# Patient Record
Sex: Male | Born: 1965 | Race: Black or African American | Hispanic: No | Marital: Married | Smoking: Never smoker
Health system: Southern US, Community
[De-identification: ages and names within clinical notes are randomized; demographics above are authoritative.]

## PROBLEM LIST (undated history)

## (undated) DIAGNOSIS — K219 Gastro-esophageal reflux disease without esophagitis: Secondary | ICD-10-CM

## (undated) DIAGNOSIS — I10 Essential (primary) hypertension: Secondary | ICD-10-CM

## (undated) DIAGNOSIS — E7800 Pure hypercholesterolemia, unspecified: Secondary | ICD-10-CM

---

## 2009-08-13 ENCOUNTER — Encounter: Admission: RE | Admit: 2009-08-13 | Discharge: 2009-08-13 | Payer: Self-pay | Admitting: Cardiology

## 2009-08-19 ENCOUNTER — Ambulatory Visit (HOSPITAL_COMMUNITY): Admission: RE | Admit: 2009-08-19 | Discharge: 2009-08-19 | Payer: Self-pay | Admitting: Cardiology

## 2010-10-23 LAB — GLUCOSE, CAPILLARY
Glucose-Capillary: 82 mg/dL (ref 70–99)
Glucose-Capillary: 94 mg/dL (ref 70–99)

## 2010-10-23 LAB — CBC: RBC: 4.99 MIL/uL (ref 4.22–5.81)

## 2010-12-31 IMAGING — CR DG CHEST 2V
2 series · 2 of 2 positions shown · non-contrast
Comparison: None

CLINICAL DATA: Chest pain.  Preoperative evaluation for cardiac
catheterization.  Nonsmoker

CHEST - 2 VIEW

[view not recorded (1 of 2)]
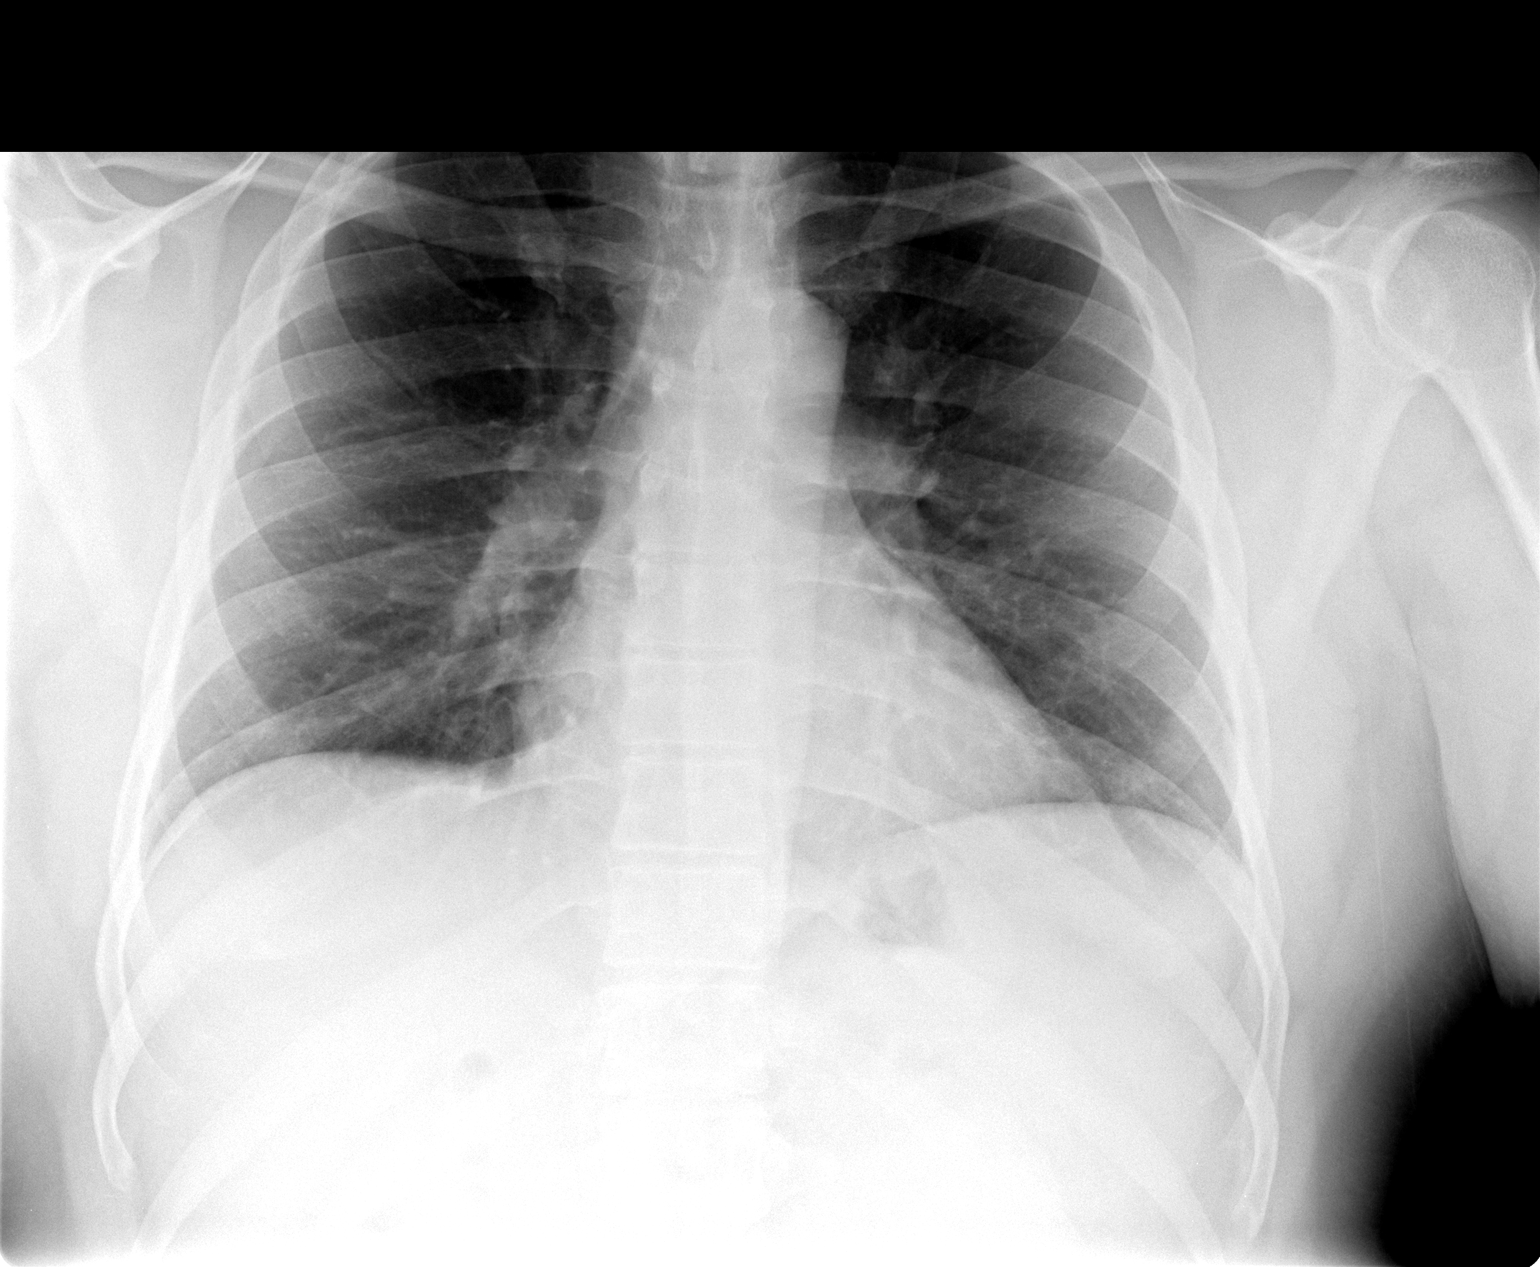

[view not recorded (2 of 2)]
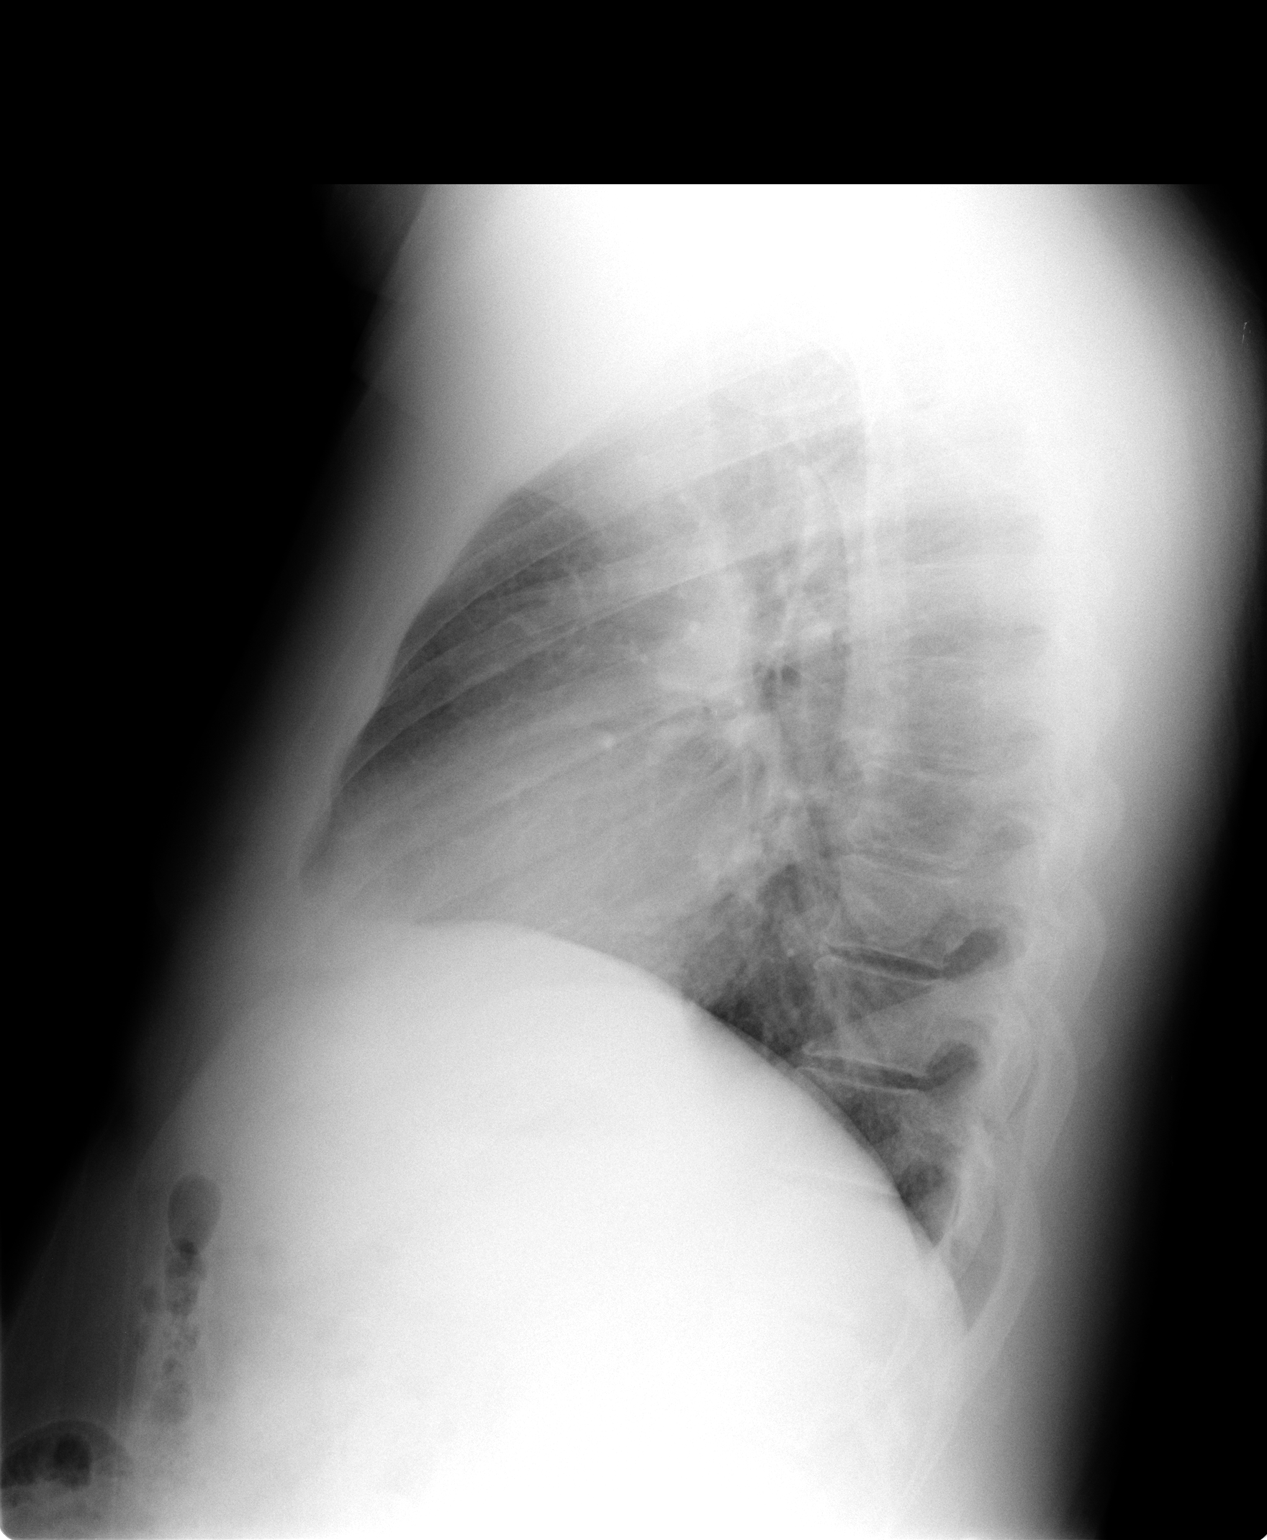

[2 of 2 positions shown; findings below may reference images not displayed]

FINDINGS: Poor lung volumes are present with resultant crowding of
the bronchovascular markings at both lung bases.  Taking this into
consideration heart and mediastinal contours are within normal
limits.  The lung fields are otherwise clear with no evidence for
focal infiltrate, congestive failure, peribronchial cuffing or
pleural fluid.

Bony structures appear intact.
IMPRESSION: No acute cardiopulmonary abnormality noted.

## 2012-01-04 ENCOUNTER — Encounter (HOSPITAL_COMMUNITY): Payer: Self-pay | Admitting: *Deleted

## 2012-01-04 ENCOUNTER — Emergency Department (HOSPITAL_COMMUNITY)
Admission: EM | Admit: 2012-01-04 | Discharge: 2012-01-04 | Disposition: A | Discharge status: Home / Self Care | Payer: 59 | Attending: Emergency Medicine | Admitting: Emergency Medicine

## 2012-01-04 DIAGNOSIS — E7800 Pure hypercholesterolemia, unspecified: Secondary | ICD-10-CM | POA: Insufficient documentation

## 2012-01-04 DIAGNOSIS — I10 Essential (primary) hypertension: Secondary | ICD-10-CM | POA: Insufficient documentation

## 2012-01-04 DIAGNOSIS — E119 Type 2 diabetes mellitus without complications: Secondary | ICD-10-CM | POA: Insufficient documentation

## 2012-01-04 DIAGNOSIS — K625 Hemorrhage of anus and rectum: Secondary | ICD-10-CM | POA: Insufficient documentation

## 2012-01-04 DIAGNOSIS — K219 Gastro-esophageal reflux disease without esophagitis: Secondary | ICD-10-CM | POA: Insufficient documentation

## 2012-01-04 HISTORY — DX: Gastro-esophageal reflux disease without esophagitis: K21.9

## 2012-01-04 HISTORY — DX: Pure hypercholesterolemia, unspecified: E78.00

## 2012-01-04 HISTORY — DX: Essential (primary) hypertension: I10

## 2012-01-04 LAB — DIFFERENTIAL
Basophils Relative: 1 % (ref 0–1)
Eosinophils Absolute: 0.2 10*3/uL (ref 0.0–0.7)
Eosinophils Relative: 3 % (ref 0–5)
Lymphs Abs: 2.5 10*3/uL (ref 0.7–4.0)
Monocytes Absolute: 0.4 10*3/uL (ref 0.1–1.0)
Monocytes Relative: 4 % (ref 3–12)

## 2012-01-04 LAB — COMPREHENSIVE METABOLIC PANEL
Albumin: 4.2 g/dL (ref 3.5–5.2)
CO2: 30 mEq/L (ref 19–32)
Chloride: 98 mEq/L (ref 96–112)
Creatinine, Ser: 0.95 mg/dL (ref 0.50–1.35)
GFR calc Af Amer: >90 mL/min (ref >90)
GFR calc non Af Amer: >90 mL/min (ref >90)
Glucose, Bld: 133 mg/dL — ABNORMAL HIGH (ref 70–99)
Potassium: 3.6 mEq/L (ref 3.5–5.1)
Total Bilirubin: 0.4 mg/dL (ref 0.3–1.2)

## 2012-01-04 LAB — CBC
HCT: 38.6 % — ABNORMAL LOW (ref 39.0–52.0)
Hemoglobin: 13 g/dL (ref 13.0–17.0)
MCHC: 33.7 g/dL (ref 30.0–36.0)

## 2012-01-04 NOTE — Discharge Instructions (Signed)
Don't take any aspirin until all signs of bleeding have stopped for 1 week. Make an appointment with your primary care provider. Discuss with him whether you should have another colonoscopy. Return to the emergency department if breathing is getting worse.  Rectal Bleeding Rectal bleeding is when blood passes out of the anus. It is usually a sign that something is wrong. It may not be serious, but it should always be evaluated. Rectal bleeding may present as bright red blood or extremely dark stools. The color may range from dark red or maroon to black (like tar). It is important that the cause of rectal bleeding be identified so treatment can be started and the problem corrected. CAUSES   Hemorrhoids. These are enlarged (dilated) blood vessels or veins in the anal or rectal area.   Fistulas. Theseare abnormal, burrowing channels that usually run from inside the rectum to the skin around the anus. They can bleed.   Anal fissures. This is a tear in the tissue of the anus. Bleeding occurs with bowel movements.   Diverticulosis. This is a condition in which pockets or sacs project from the bowel wall. Occasionally, the sacs can bleed.   Diverticulitis. Thisis an infection involving diverticulosis of the colon.   Proctitis and colitis. These are conditions in which the rectum, colon, or both, can become inflamed and pitted (ulcerated).   Polyps and cancer. Polyps are non-cancerous (benign) growths in the colon that may bleed. Certain types of polyps turn into cancer.   Protrusion of the rectum. Part of the rectum can project from the anus and bleed.   Certain medicines.   Intestinal infections.   Blood vessel abnormalities.  HOME CARE INSTRUCTIONS  Eat a high-fiber diet to keep your stool soft.   Limit activity.   Drink enough fluids to keep your urine clear or pale yellow.   Warm baths may be useful to soothe rectal pain.   Follow up with your caregiver as directed.  SEEK  IMMEDIATE MEDICAL CARE IF:  You develop increased bleeding.   You have black or dark red stools.   You vomit blood or material that looks like coffee grounds.   You have abdominal pain or tenderness.   You have a fever.   You feel weak, nauseous, or you faint.   You have severe rectal pain or you are unable to have a bowel movement.  MAKE SURE YOU:  Understand these instructions.   Will watch your condition.   Will get help right away if you are not doing well or get worse.  Document Released: 01/13/2002 Document Revised: 07/13/2011 Document Reviewed: 01/08/2011 [facility] Patient Information 2012 [place], [place].

## 2012-01-04 NOTE — ED Notes (Signed)
Pt reports having large amounts of bright red blood in stools starting this am. Also wants jaw pain examined, left jaw and throat pain x 1 week. Airway intact, having n/v.

## 2012-01-04 NOTE — ED Provider Notes (Signed)
History  This chart was scribed for Dione Booze, MD by Bennett Scrape. This patient was seen in room STRE8/STRE8 and the patient's care was started at 12:50PM.  CSN: [id number]  Arrival date & time 01/04/12  1130   First MD Initiated Contact with Patient 01/04/12 1250      Chief Complaint  Patient presents with  . Rectal Bleeding    The history is provided by the patient. No language interpreter was used.    Christopher Beltran is a 46 y.o. male who presents to the Emergency Department complaining of two episodes of hematochezia described as bright red blood with associated fatigue. He states that the first episode was similar to episodes he has experienced in the past with constipation. He states that he became concerned when the second episode occurred only one hour after the first and he describes it as being "a large amount of bright red blood with a small amount of stool". He reports that he had a negative colonoscopy about 6 years ago. He denies abdominal pain, nausea and emesis as associated symptoms. He is an occasional alcohol user but denies smoking.  PCP is Dr. Beryle Quant.   Past Medical History  Diagnosis Date  . Diabetes mellitus   . Hypertension   . Acid reflux   . High cholesterol     History reviewed. No pertinent past surgical history.  History reviewed. No pertinent family history.  History  Substance Use Topics  . Smoking status: Not on file  . Smokeless tobacco: Not on file  . Alcohol Use: Yes     occ wine      Review of Systems  Constitutional: Positive for fatigue. Negative for fever and chills.  Respiratory: Negative for shortness of breath.   Gastrointestinal: Positive for blood in stool. Negative for nausea and vomiting.  Neurological: Negative for weakness.    Allergies  Statins  Home Medications   Current Outpatient Rx  Name Route Sig Dispense Refill  . CHOLECALCIFEROL 400 UNITS PO TABS Oral Take 400 Units by mouth daily.    Marland Kitchen  GLIPIZIDE-METFORMIN HCL 5-500 MG PO TABS Oral Take 1 tablet by mouth 2 (two) times daily before a meal.    . IBUPROFEN 200 MG PO TABS Oral Take 200 mg by mouth every 6 (six) hours as needed. For pain    . LOSARTAN POTASSIUM-HCTZ 50-12.5 MG PO TABS Oral Take 1 tablet by mouth daily.    Marland Kitchen RABEPRAZOLE SODIUM 20 MG PO TBEC Oral Take 20 mg by mouth daily.      Triage Vitals: BP 138/84  Pulse 89  Temp(Src) 98 F (36.7 C) (Oral)  Resp 15  SpO2 100%  Physical Exam  Nursing note and vitals reviewed. Constitutional: He is oriented to person, place, and time. He appears well-developed and well-nourished. No distress.  HENT:  Head: Normocephalic and atraumatic.  Eyes: EOM are normal.  Neck: Neck supple. No tracheal deviation present.  Cardiovascular: Normal rate.   Pulmonary/Chest: Effort normal. No respiratory distress.  Genitourinary: Rectum normal.       Small amount of bright red blood  Musculoskeletal: Normal range of motion.  Neurological: He is alert and oriented to person, place, and time.  Skin: Skin is warm and dry.  Psychiatric: He has a normal mood and affect. His behavior is normal.    ED Course  Procedures (including critical care time)  DIAGNOSTIC STUDIES: Oxygen Saturation is 100% on room air, normal by my interpretation.    COORDINATION OF  CARE: 1:00PM-Discussed treatment plan with pt and pt agreed to plan. 2:39PM-Pt rechecked and is feeling better. 2:49PM-Discussed lab results and discharge plan with pt and pt agreed. Advised pt to follow up with PCP.  Results for orders placed during the hospital encounter of 01/04/12  COMPREHENSIVE METABOLIC PANEL      Component Value Range   Sodium 138  135 - 145 (mEq/L)   Potassium 3.6  3.5 - 5.1 (mEq/L)   Chloride 98  96 - 112 (mEq/L)   CO2 30  19 - 32 (mEq/L)   Glucose, Bld 133 (*) 70 - 99 (mg/dL)   BUN 14  6 - 23 (mg/dL)   Creatinine, Ser 0.10  0.50 - 1.35 (mg/dL)   Calcium 27.2  8.4 - 10.5 (mg/dL)   Total Protein 7.9   6.0 - 8.3 (g/dL)   Albumin 4.2  3.5 - 5.2 (g/dL)   AST 21  0 - 37 (U/L)   ALT 23  0 - 53 (U/L)   Alkaline Phosphatase 60  39 - 117 (U/L)   Total Bilirubin 0.4  0.3 - 1.2 (mg/dL)   GFR calc non Af Amer >90  >90 (mL/min)   GFR calc Af Amer >90  >90 (mL/min)  CBC      Component Value Range   WBC 8.8  4.0 - 10.5 (K/uL)   RBC 5.02  4.22 - 5.81 (MIL/uL)   Hemoglobin 13.0  13.0 - 17.0 (g/dL)   HCT 53.6 (*) 64.4 - 52.0 (%)   MCV 76.9 (*) 78.0 - 100.0 (fL)   MCH 25.9 (*) 26.0 - 34.0 (pg)   MCHC 33.7  30.0 - 36.0 (g/dL)   RDW 03.4  74.2 - 59.5 (%)   Platelets 386  150 - 400 (K/uL)  DIFFERENTIAL      Component Value Range   Neutrophils Relative 64  43 - 77 (%)   Neutro Abs 5.6  1.7 - 7.7 (K/uL)   Lymphocytes Relative 29  12 - 46 (%)   Lymphs Abs 2.5  0.7 - 4.0 (K/uL)   Monocytes Relative 4  3 - 12 (%)   Monocytes Absolute 0.4  0.1 - 1.0 (K/uL)   Eosinophils Relative 3  0 - 5 (%)   Eosinophils Absolute 0.2  0.0 - 0.7 (K/uL)   Basophils Relative 1  0 - 1 (%)   Basophils Absolute 0.1  0.0 - 0.1 (K/uL)  OCCULT BLOOD, POC DEVICE      Component Value Range   Fecal Occult Bld POSITIVE       1. Rectal bleeding       MDM  Rectal bleeding but he does not appear to be actively bleeding currently. Orthostatic vital signs will be checked as well as hemoglobin.  Hemoglobin is normal. He was observed in the emergency department and had another bowel movement with significantly less blood. He continues to be hemodynamically stable. I see no indication for hospital admission. He is discharged with instructions to follow up with his gastroenterologist to consider repeat colonoscopy.      I personally performed the services described in this documentation, which was scribed in my presence. The recorded information has been reviewed and considered.      Dione Booze, MD 01/07/12 [phone]

## 2012-01-04 NOTE — ED Notes (Signed)
Pt presents with onset of bright red blood from rectum first noted this morning.  Pt reports pain to L side of abdomen that radiates into lower abdomen and generalized malaise x 1-2 weeks.  Pt reports bleeding only with bowel movement, denies any rectal pain or rectal penetration.  Pt also reports L jaw pain x 2 weeks, had dental check up with cleaning but did not receive any injections, reports pain has been constant and worsens when chewing.

## 2013-12-19 ENCOUNTER — Other Ambulatory Visit: Payer: Self-pay | Admitting: Internal Medicine

## 2013-12-19 DIAGNOSIS — M545 Low back pain, unspecified: Secondary | ICD-10-CM

## 2013-12-25 ENCOUNTER — Ambulatory Visit
Admission: RE | Admit: 2013-12-25 | Discharge: 2013-12-25 | Disposition: A | Discharge status: Home / Self Care | Source: Ambulatory Visit | Attending: Internal Medicine | Admitting: Internal Medicine

## 2013-12-25 DIAGNOSIS — M545 Low back pain, unspecified: Secondary | ICD-10-CM

## 2013-12-25 MED ORDER — GADOBENATE DIMEGLUMINE 529 MG/ML IV SOLN
19.0000 mL | Freq: Once | INTRAVENOUS | Status: AC | PRN
  Administered 2013-12-25: 19 mL via INTRAVENOUS

## 2014-01-12 ENCOUNTER — Encounter: Payer: Self-pay | Admitting: Podiatry

## 2014-01-12 ENCOUNTER — Ambulatory Visit (INDEPENDENT_AMBULATORY_CARE_PROVIDER_SITE_OTHER): Admitting: Podiatry

## 2014-01-12 VITALS — BP 119/80 | HR 88 | Ht 70.0 in | Wt 210.0 lb

## 2014-01-12 DIAGNOSIS — B351 Tinea unguium: Secondary | ICD-10-CM

## 2014-01-12 DIAGNOSIS — E119 Type 2 diabetes mellitus without complications: Secondary | ICD-10-CM | POA: Insufficient documentation

## 2014-01-12 NOTE — Patient Instructions (Signed)
Seen for diabetic foot check. Noted of light fungal nails.  May benefit from OTC antifungal drops. Return as needed.

## 2014-01-12 NOTE — Progress Notes (Signed)
Subjective: 48 year old male presents for diabetic foot check.  Diabetic since 1999. Blood sugar is under control. Hg A1C was up little, 8.9. Now getting medication adjusted.  Review of Systems - General ROS: negative for - chills, fatigue, fever, night sweats, sleep disturbance, weight gain or weight loss Ophthalmic ROS: negative for - blurry vision, decreased vision, double vision, itchy eyes or loss of vision ENT ROS: negative Allergy and Immunology ROS: Hayfever, pollens. Respiratory ROS: no cough, shortness of breath, or wheezing Cardiovascular ROS: no chest pain or dyspnea on exertion Gastrointestinal ROS: no abdominal pain, change in bowel habits, or black or bloody stools Genito-Urinary ROS: no dysuria, trouble voiding, or hematuria Musculoskeletal ROS: Back and shoulder pain random. Shoulder pain from previous injury.  Neurological ROS: no TIA or stroke symptoms Dermatological ROS: None abnormal.   Objective: Dermatologic: No open lesions. Mild yellow thickened nails x 10.  Vascular: All pedal pulses are palpable. No edema or erythema.  Neurologic: Normal response to Monofilament sensory testing.  Orthopedic: Rectus foot without gross deformities.  Assessment: Mild fungal nails x 10.   Plan: Reviewed findings.  Many benefit from antifungal medication.

## 2015-02-23 ENCOUNTER — Encounter: Payer: Self-pay | Admitting: Physical Therapy

## 2015-02-23 ENCOUNTER — Ambulatory Visit: Payer: Federal, State, Local not specified - PPO | Attending: Internal Medicine | Admitting: Physical Therapy

## 2015-02-23 DIAGNOSIS — M5441 Lumbago with sciatica, right side: Secondary | ICD-10-CM | POA: Insufficient documentation

## 2015-02-23 NOTE — Therapy (Signed)
[facility] [place] [address] [place], [place], [address] Phone: [phone]   Fax:  [phone]  Physical Therapy Evaluation  Patient Details  Name: Christopher Beltran MRN: [id number] Date of Birth: 09/14/1965 Referring Provider:  Jackie Plumsei-Bonsu, George, MD  Encounter Date: 02/23/2015      PT End of Session - 02/23/15 1441    Visit Number 1   Date for PT Re-Evaluation 04/26/15   PT Start Time 1404   PT Stop Time 1500   PT Time Calculation (min) 56 min   Activity Tolerance Patient tolerated treatment well   Behavior [facility] Therapy [facility] for tasks assessed/performed      Past Medical History  Diagnosis Date  . Diabetes mellitus   . Hypertension   . Acid reflux   . High cholesterol     History reviewed. No pertinent past surgical history.  There were no vitals filed for this visit.  Visit Diagnosis:  Right-sided low back pain with right-sided sciatica - Plan: PT plan of care cert/re-cert      Subjective Assessment - 02/23/15 1405    Subjective Patient reports that he has been having some back pain for about 20 years since he was in the [employer].  Reports recently worse, he is unsure of any specific cause.     Limitations Sitting;Lifting;House hold activities   How long can you sit comfortably? 45 minutes   Diagnostic tests MRI slight bulging L5-S1 and some degeneration   Currently in Pain? Yes   Pain Score 5    Pain Location Back   Pain Orientation Lower;Right   Pain Type Chronic pain   Pain Radiating Towards down into the right HS   Pain Onset More than a month ago   Pain Frequency Constant   Aggravating Factors  in the morning, with walking, pain up to 7/10   Pain Relieving Factors sometimes walking helps, at best 2/10   Effect of Pain on Daily Activities limits some ADL's due to pain            [facility] Assessment - 02/23/15 0001    Assessment   Medical Diagnosis low back pain   Onset Date/Surgical Date  12/24/14   Prior Therapy 20 years ago   Precautions   Precautions None   Balance Screen   Has the patient fallen in the past 6 months No   Has the patient had a decrease in activity level because of a fear of falling?  No   Is the patient reluctant to leave their home because of a fear of falling?  No   Home [employer] information [employer] managernvironment   Living Environment Private residence   Prior Function   Level of Independence Independent   Vocation Part time employment   Vocation Requirements mostly sitting, computer work   Leisure some exercise and basketball a few times a week   Posture/Postural Control   Posture Comments some slouched sitting   AROM   Overall AROM Comments Lumbar ROM was decreased 50%   Flexibility   Soft Tissue Assessment /Muscle Length --  very tight ITB, HS and piriformis mms   Palpation   Palpation comment very tender over a knot in the right lumbar area at L4-5, tight ITB and HS                           PT Education - 02/23/15 1440    Education provided Yes   Education  Details Wms flexion exercises, HS, piriformis and ITB stretches   Person(s) Educated Patient   Methods Explanation;Demonstration;Handout   Comprehension Verbalized understanding          PT Short Term Goals - 03-16-2015 1451    PT SHORT TERM GOAL #1   Title independent with initial HEP   Time 1   Period Weeks   Status New           PT Long Term Goals - 03/16/15 1451    PT LONG TERM GOAL #1   Title decrease pain 50%   Time 8   Period Weeks   Status New   PT LONG TERM GOAL #2   Title increase lumbar ROM to WNL's   Time 8   Period Weeks   Status New   PT LONG TERM GOAL #3   Title understand proper posture and body mechanics   Time 8   Period Weeks   Status New   PT LONG TERM GOAL #4   Title increase SLR to 70 degrees   Baseline currently at 40 degrees   Time 8   Period Weeks   Status New               Plan - 16-Mar-2015 1448    Clinical Impression Statement  Patient with c/o low back pain, reports has been chronic but usually he can get it to go away, reports that this episode has lingered to be come constant pain and the pain has gotten worse   Pt will benefit from skilled therapeutic intervention in order to improve on the following deficits Decreased range of motion;Impaired flexibility;Increased muscle spasms;Pain   Rehab Potential Good   PT Frequency 2x / week   PT Duration 8 weeks   PT Treatment/Interventions ADLs/Self Care Home Management;Electrical Stimulation;Ultrasound;Traction;Moist Heat;Functional mobility training;Therapeutic exercise;Manual techniques   PT Next Visit Plan add gym exercises   Consulted and Agree with Plan of Care Patient          G-Codes - 03-16-15 1458    Functional Assessment Tool Used foto   Functional Limitation Other PT primary   Other PT Primary Current Status (Z6109) At least 40 percent but less than 60 percent impaired, limited or restricted   Other PT Primary Goal Status (U0454) At least 20 percent but less than 40 percent impaired, limited or restricted       Problem List Patient Active Problem List   Diagnosis Date Noted  . Onychomycosis 01/12/2014  . Diabetes 01/12/2014    Jearld Lesch., PT 16-Mar-2015, 3:01 PM  [facility] [place] [address] [place], [place], [address] Phone: [phone]   Fax:  [phone]

## 2015-03-01 ENCOUNTER — Ambulatory Visit: Payer: Federal, State, Local not specified - PPO | Admitting: Physical Therapy

## 2015-03-01 ENCOUNTER — Encounter: Payer: Self-pay | Admitting: Physical Therapy

## 2015-03-01 DIAGNOSIS — M5441 Lumbago with sciatica, right side: Secondary | ICD-10-CM | POA: Diagnosis not present

## 2015-03-01 NOTE — Therapy (Signed)
[facility] [place] [address] [place], [place], [address] Phone: [phone]   Fax:  [phone]  Physical Therapy Treatment  Patient Details  Name: Christopher Beltran MRN: [id number] Date of Birth: 08-May-1966 Referring Provider:  Jackie Plum, MD  Encounter Date: 03/01/2015      PT End of Session - 03/01/15 0839    Visit Number 2   PT Start Time 0757   PT Stop Time 0900   PT Time Calculation (min) 63 min   Activity Tolerance Patient tolerated treatment well   Behavior During Therapy [facility] for tasks assessed/performed      Past Medical History  Diagnosis Date  . Diabetes mellitus   . Hypertension   . Acid reflux   . High cholesterol     History reviewed. No pertinent past surgical history.  There were no vitals filed for this visit.  Visit Diagnosis:  Right-sided low back pain with right-sided sciatica      Subjective Assessment - 03/01/15 0759    Subjective Some soreness in the low back.  Has c/o weakness in the right leg more than the left.   Currently in Pain? Yes   Pain Score 3    Pain Location Back   Pain Orientation Right;Lower   Pain Descriptors / Indicators Aching   Pain Type Chronic pain   Pain Onset More than a month ago   Pain Frequency Constant                         [facility] Adult PT Treatment/Exercise - 03/01/15 0001    Lumbar Exercises: Aerobic   Tread Mill NuStep L5 x 5 minutes   Lumbar Exercises: Machines for Strengthening   Leg Press 30# 2x10   Other Lumbar Machine Exercise lat pulls 25#, seated row 25#   Other Lumbar Machine Exercise 10# obliques   Lumbar Exercises: Supine   Bridge 20 reps;2 seconds   Bridge Limitations feet on ball   Large Ball Abdominal Isometric 20 reps;2 seconds   Other Supine Lumbar Exercises feet on ball K2C and trunk rotation   Modalities   Modalities Traction   Programme researcher, broadcasting/film/video Location lumbar spine   Electrical Stimulation Action IFC   Electrical Stimulation Parameters tolerance   Electrical Stimulation Goals Pain   Traction   Type of Traction Lumbar   Min (lbs) 60   Max (lbs) 60   Hold Time static   Time 15                  PT Short Term Goals - 02/23/15 1451    PT SHORT TERM GOAL #1   Title independent with initial HEP   Time 1   Period Weeks   Status New           PT Long Term Goals - 02/23/15 1451    PT LONG TERM GOAL #1   Title decrease pain 50%   Time 8   Period Weeks   Status New   PT LONG TERM GOAL #2   Title increase lumbar ROM to WNL's   Time 8   Period Weeks   Status New   PT LONG TERM GOAL #3   Title understand proper posture and body mechanics   Time 8   Period Weeks   Status New   PT LONG TERM GOAL #4   Title increase SLR to 70 degrees   Baseline currently at  40 degrees   Time 8   Period Weeks   Status New               Plan - 03/01/15 1610    Clinical Impression Statement Did well with exercises, he is weak in the core and very tight in the LE's.   PT Next Visit Plan add stretches manually next visit   Consulted and Agree with Plan of Care Patient        Problem List Patient Active Problem List   Diagnosis Date Noted  . Onychomycosis 01/12/2014  . Diabetes 01/12/2014    Jearld Lesch., PT 03/01/2015, 8:40 AM  [facility] [place] [address] [place], [place], [address] Phone: [phone]   Fax:  [phone]

## 2015-03-03 ENCOUNTER — Encounter: Payer: Self-pay | Admitting: Physical Therapy

## 2015-03-03 ENCOUNTER — Ambulatory Visit: Payer: Federal, State, Local not specified - PPO | Admitting: Physical Therapy

## 2015-03-03 DIAGNOSIS — M5441 Lumbago with sciatica, right side: Secondary | ICD-10-CM | POA: Diagnosis not present

## 2015-03-03 NOTE — Therapy (Signed)
[facility] [place] [address] [place], [place], [address] Phone: [phone]   Fax:  [phone]  Physical Therapy Treatment  Patient Details  Name: Christopher Beltran MRN: [id number] Date of Birth: June 01, 1966 Referring Provider:  Jackie Plum, MD  Encounter Date: 03/03/2015      PT End of Session - 03/03/15 0926    Visit Number 3   Date for PT Re-Evaluation 04/26/15   PT Start Time 0847   PT Stop Time 0942   PT Time Calculation (min) 55 min      Past Medical History  Diagnosis Date  . Diabetes mellitus   . Hypertension   . Acid reflux   . High cholesterol     History reviewed. No pertinent past surgical history.  There were no vitals filed for this visit.  Visit Diagnosis:  Right-sided low back pain with right-sided sciatica      Subjective Assessment - 03/03/15 0848    Subjective Pt reports that he is a little tired but has his normal soreness   Limitations Sitting;Lifting;House hold activities   Diagnostic tests MRI slight bulging L5-S1 and some degeneration   Currently in Pain? Yes   Pain Score 3    Pain Location Back   Pain Orientation Right   Pain Descriptors / Indicators Sore   Pain Type Chronic pain   Pain Onset More than a month ago                         [facility] Adult PT Treatment/Exercise - 03/03/15 0001    Lumbar Exercises: Aerobic   Tread Mill NuStep L5 x 5 minutes   Lumbar Exercises: Machines for Strengthening   Leg Press 50# 2x10   Other Lumbar Machine Exercise lat pulls 35#   Other Lumbar Machine Exercise Standing AR press #25 10 reps    Lumbar Exercises: Standing   Row Power tower;Strengthening;10 reps  3 sets, #55   Other Standing Lumbar Exercises Straight arm pull downs # 55 10 reps 3 sets    Modalities   Modalities Traction   Traction   Type of Traction Lumbar   Min (lbs) 75   Max (lbs) 75   Hold Time static   Time 15                  PT  Short Term Goals - 02/23/15 1451    PT SHORT TERM GOAL #1   Title independent with initial HEP   Time 1   Period Weeks   Status New           PT Long Term Goals - 03/03/15 0932    PT LONG TERM GOAL #3   Title understand proper posture and body mechanics   Status On-going               Plan - 03/03/15 0927    Clinical Impression Statement Pt tolerated exercises with increased intensities and weight. Pt core remains weak and requires cues to maintain proper posture    Pt will benefit from skilled therapeutic intervention in order to improve on the following deficits Decreased range of motion;Impaired flexibility;Increased muscle spasms;Pain   PT Frequency 2x / week   PT Duration 8 weeks   PT Treatment/Interventions ADLs/Self Care Home Management;Electrical Stimulation;Ultrasound;Traction;Moist Heat;Functional mobility training;Therapeutic exercise;Manual techniques   PT Next Visit Plan add stretches manually next visit        Problem List Patient Active  Problem List   Diagnosis Date Noted  . Onychomycosis 01/12/2014  . Diabetes 01/12/2014    Grayce Sessions, PTA 03/03/2015, 9:33 AM  [facility] [place] [address] [place], [place], [address] Phone: [phone]   Fax:  [phone]

## 2015-03-09 ENCOUNTER — Ambulatory Visit: Payer: Federal, State, Local not specified - PPO | Attending: Internal Medicine | Admitting: Physical Therapy

## 2015-03-09 ENCOUNTER — Encounter: Payer: Self-pay | Admitting: Physical Therapy

## 2015-03-09 DIAGNOSIS — M5441 Lumbago with sciatica, right side: Secondary | ICD-10-CM | POA: Diagnosis not present

## 2015-03-09 NOTE — Therapy (Signed)
[facility] [address] [place], [place], [address] Phone: [phone]   Fax:  [phone]  Physical Therapy Treatment  Patient Details  Name: Christopher Beltran MRN: [id number] Date of Birth: 1965-12-08 Referring Provider:  Jackie Plum, MD  Encounter Date: 03/09/2015      PT End of Session - 03/09/15 1107    Visit Number 4   Date for PT Re-Evaluation 04/26/15   PT Start Time 0941   PT Stop Time 1045   PT Time Calculation (min) 64 min   Activity Tolerance Patient tolerated treatment well   Behavior During Therapy [facility] for tasks assessed/performed      Past Medical History  Diagnosis Date  . Diabetes mellitus   . Hypertension   . Acid reflux   . High cholesterol     History reviewed. No pertinent past surgical history.  There were no vitals filed for this visit.  Visit Diagnosis:  Right-sided low back pain with right-sided sciatica      Subjective Assessment - 03/09/15 0945    Subjective Some soreness from yardwork this weekend   Currently in Pain? Yes   Pain Score 3    Pain Location Back   Pain Orientation Right;Lower                         [facility] Adult PT Treatment/Exercise - 03/09/15 0001    Lumbar Exercises: Aerobic   Tread Mill NuStep L5 x 6 minutes   Lumbar Exercises: Machines for Strengthening   Other Lumbar Machine Exercise lat pulls 35#, seated rows 35#, 10# hip extension, 5# hip abduction   Other Lumbar Machine Exercise calf stretches   Lumbar Exercises: Standing   Other Standing Lumbar Exercises Stright arm pull downs # 55 10 reps 3 sets    Lumbar Exercises: Supine   Large Ball Abdominal Isometric 20 reps;2 seconds   Other Supine Lumbar Exercises feet on ball K2C and trunk rotation   Knee/Hip Exercises: Stretches   Passive Hamstring Stretch 30 seconds;3 reps   ITB Stretch 30 seconds;3 reps   Piriformis Stretch 30 seconds;3 reps   Electrical Stimulation   Electrical Stimulation Location lumbar spine   Electrical Stimulation Action IFC   Electrical Stimulation Parameters tolerance pain   Electrical Stimulation Goals Pain                  PT Short Term Goals - 02/23/15 1451    PT SHORT TERM GOAL #1   Title independent with initial HEP   Time 1   Period Weeks   Status New           PT Long Term Goals - 03/03/15 0932    PT LONG TERM GOAL #3   Title understand proper posture and body mechanics   Status On-going               Plan - 03/09/15 1107    Clinical Impression Statement Still very tight in the LE's, has some difficulty with proper body mechanics due to this.  Weakness in the hips and core   PT Next Visit Plan go over posture and body mechanics   Consulted and Agree with Plan of Care Patient        Problem List Patient Active Problem List   Diagnosis Date Noted  . Onychomycosis 01/12/2014  . Diabetes 01/12/2014    Jearld Lesch., PT 03/09/2015, 11:09 AM   [facility] [address] [place], [place], [address] Phone: [phone]   Fax:  [phone]

## 2015-03-16 ENCOUNTER — Ambulatory Visit: Payer: Federal, State, Local not specified - PPO | Admitting: Physical Therapy

## 2015-03-16 ENCOUNTER — Encounter: Payer: Self-pay | Admitting: Physical Therapy

## 2015-03-16 DIAGNOSIS — M5441 Lumbago with sciatica, right side: Secondary | ICD-10-CM | POA: Diagnosis not present

## 2015-03-16 NOTE — Therapy (Signed)
[place], [place], [address] Phone: [phone]   Fax:  [phone]  Physical Therapy Treatment  Patient Details  Name: KETRICK MATNEY MRN: [id number] Date of Birth: [birth year] Referring Provider:  Benito Mccreedy, MD  Encounter Date: 03/16/2015      PT End of Session - 03/16/15 1155    Visit Number 5   Date for PT Re-Evaluation 04/26/15   PT Start Time 1018   PT Stop Time 1117   PT Time Calculation (min) 59 min   Activity Tolerance Patient tolerated treatment well   Behavior During Therapy [facility] for tasks assessed/performed      Past Medical History  Diagnosis Date  . Diabetes mellitus   . Hypertension   . Acid reflux   . High cholesterol     History reviewed. No pertinent past surgical history.  There were no vitals filed for this visit.  Visit Diagnosis:  Right-sided low back pain with right-sided sciatica      Subjective Assessment - 03/16/15 1152    Subjective Reports that he is feeling less pain and feeling like he is more flexible.  Still pain after sitting for long periods, also with decreased core and hip strength   Currently in Pain? Yes   Pain Score 2    Pain Location Back   Pain Orientation Right;Lower   Aggravating Factors  sitting   Pain Relieving Factors rest                         [facility] Adult PT Treatment/Exercise - 03/16/15 0001    Lumbar Exercises: Aerobic   Elliptical R= 5 I =6 x 6 minutes   Lumbar Exercises: Machines for Strengthening   Leg Press 50# 2x10   Other Lumbar Machine Exercise lat pulls 35#, seated rows 35#, 10# hip extension, 5# hip abduction   Other Lumbar Machine Exercise 15# obliques, 10 # hip extension and abduction, planks   Lumbar Exercises: Standing   Other Standing Lumbar Exercises Stright arm pull downs # 55 10 reps 3 sets    Lumbar Exercises: Supine   Large Ball Abdominal Isometric 20 reps;2 seconds   Other  Supine Lumbar Exercises feet on ball K2C and trunk rotation   Knee/Hip Exercises: Stretches   Passive Hamstring Stretch 30 seconds;3 reps   ITB Stretch 30 seconds;3 reps   Piriformis Stretch 30 seconds;3 reps   Electrical Stimulation   Electrical Stimulation Location lumbar spine   Electrical Stimulation Action IFC   Electrical Stimulation Parameters tolerance   Electrical Stimulation Goals Pain   Traction   Type of Traction Lumbar   Min (lbs) 75   Max (lbs) 75   Hold Time static   Time 15                  PT Short Term Goals - 02/23/15 1451    PT SHORT TERM GOAL #1   Title independent with initial HEP   Time 1   Period Weeks   Status New           PT Long Term Goals - 03/16/15 1202    PT LONG TERM GOAL #1   Title decrease pain 50%   Status Partially Met   PT LONG TERM GOAL #2   Title increase lumbar ROM to WNL's   Status Partially Met   PT LONG TERM GOAL #4   Title increase SLR to 70 degrees  Status Partially Met               Plan - 03/16/15 1156    Clinical Impression Statement LE are tight but much improved, weakness in the core and in the hips   PT Next Visit Plan add exercises   Consulted and Agree with Plan of Care Patient        Problem List Patient Active Problem List   Diagnosis Date Noted  . Onychomycosis 01/12/2014  . Diabetes 01/12/2014    Sumner Boast., PT 03/16/2015, 12:03 PM  [place] [address] [place], [place], [address] Phone: [phone]   Fax:  [phone]

## 2015-03-18 ENCOUNTER — Ambulatory Visit: Payer: Federal, State, Local not specified - PPO | Admitting: Physical Therapy

## 2015-03-19 ENCOUNTER — Ambulatory Visit: Payer: Federal, State, Local not specified - PPO | Admitting: Physical Therapy

## 2015-03-19 ENCOUNTER — Encounter: Payer: Self-pay | Admitting: Physical Therapy

## 2015-03-19 DIAGNOSIS — M5441 Lumbago with sciatica, right side: Secondary | ICD-10-CM

## 2015-03-19 NOTE — Therapy (Addendum)
[place], [place], [address] Phone: [phone]   Fax:  [phone]  Physical Therapy Treatment  Patient Details  Name: Christopher Beltran MRN: [id number] Date of Birth: 1965/11/03 Referring Provider:  Benito Mccreedy, MD  Encounter Date: 03/19/2015      PT End of Session - 03/19/15 1105    Visit Number 6   Date for PT Re-Evaluation 04/26/15   PT Start Time 1020   PT Stop Time 1100   PT Time Calculation (min) 40 min      Past Medical History  Diagnosis Date  . Diabetes mellitus   . Hypertension   . Acid reflux   . High cholesterol     History reviewed. No pertinent past surgical history.  There were no vitals filed for this visit.  Visit Diagnosis:  Right-sided low back pain with right-sided sciatica      Subjective Assessment - 03/19/15 1021    Subjective wonderful today, no pain   Currently in Pain? No/denies            [facility] PT Assessment - 03/19/15 0001    AROM   Overall AROM Comments Lumbar AROM WFLS except flex decreased 25% d/t HS tightness                     [facility] Adult PT Treatment/Exercise - 03/19/15 0001    Lumbar Exercises: Aerobic   Elliptical R= 5 I =6 x 6 minutes   Lumbar Exercises: Machines for Strengthening   Cybex Lumbar Extension OH ext with weighted ball 3 sets 10   Leg Press 50# 3x10   Other Lumbar Machine Exercise lat pulls 35#, seated rows 35#,   2 sets 15   Other Lumbar Machine Exercise 15# obliques, 10 # hip extension and abduction   Lumbar Exercises: Standing   Other Standing Lumbar Exercises Straight arm pull downs # 15 10 reps 3 sets    Other Standing Lumbar Exercises quadreped alt arm/leg 20 3 sec hold                PT Education - 03/19/15 1105    Education provided Yes   Education Details BM.lifting,stretching ,HEP and maintainance of LBP   Person(s) Educated Patient   Methods Explanation;Demonstration   Comprehension Verbalized understanding;Returned demonstration          PT Short Term Goals - 03/19/15 1052    PT SHORT TERM GOAL #1   Title independent with initial HEP   Status Achieved           PT Long Term Goals - 03/19/15 1104    PT LONG TERM GOAL #3   Title understand proper posture and body mechanics   Status Achieved   PT LONG TERM GOAL #4   Title increase SLR to 70 degrees               Plan - 03/19/15 1106    Clinical Impression Statement pt with good understanding of BM and LB maintainance with strength and stretches. Pt feels like he is back to baseline and is good with discharge. Goals met except SLR, HS still very tight.   PT Next Visit Plan Hold 2 weeks to assure no flare ups and D/C     PHYSICAL THERAPY DISCHARGE SUMMARY     Plan: Patient agrees to discharge.  Patient goals were met. Patient is being discharged due to meeting the stated rehab goals.  ?????  Problem List Patient Active Problem List   Diagnosis Date Noted  . Onychomycosis 01/12/2014  . Diabetes 01/12/2014    PAYSEUR,ANGIE PTA 03/19/2015, 11:08 AM  [place] [address] [place], [place], [address] Phone: [phone]   Fax:  [phone]

## 2015-07-28 ENCOUNTER — Ambulatory Visit (INDEPENDENT_AMBULATORY_CARE_PROVIDER_SITE_OTHER): Payer: Federal, State, Local not specified - PPO | Admitting: Podiatry

## 2015-07-28 ENCOUNTER — Encounter: Payer: Self-pay | Admitting: Podiatry

## 2015-07-28 VITALS — BP 127/84 | HR 79

## 2015-07-28 DIAGNOSIS — M79606 Pain in leg, unspecified: Secondary | ICD-10-CM | POA: Insufficient documentation

## 2015-07-28 DIAGNOSIS — M79673 Pain in unspecified foot: Secondary | ICD-10-CM | POA: Diagnosis not present

## 2015-07-28 DIAGNOSIS — M79604 Pain in right leg: Secondary | ICD-10-CM

## 2015-07-28 DIAGNOSIS — L600 Ingrowing nail: Secondary | ICD-10-CM | POA: Diagnosis not present

## 2015-07-28 NOTE — Patient Instructions (Signed)
Pain in right great toe lateral border nail off and on.  Debrided. Return as needed.

## 2015-07-28 NOTE — Progress Notes (Signed)
Subjective: 49 year old male presents complaining of painful nail on right lateral border, off and on. Diabetic since 1999 and under control.   Objective: Dermatologic: Pain in right lateral border with hard keratotic skin build up on lateral border. No edema or erythema noted. Vascular: All pedal pulses are palpable. No edema or erythema.  Neurologic: Normal response to Monofilament sensory testing.  Orthopedic: Rectus foot without gross deformities.  Assessment: Painful nail with callus build up on lateral border.   Plan: Reviewed findings.  Debrided nail and hard callus from lateral border right great toe.
